# Patient Record
Sex: Male | Born: 1960 | Race: White | Hispanic: No | Smoking: Current every day smoker
Health system: Southern US, Community
[De-identification: ages and names within clinical notes are randomized; demographics above are authoritative.]

## PROBLEM LIST (undated history)

## (undated) ENCOUNTER — Encounter

## (undated) ENCOUNTER — Other Ambulatory Visit

## (undated) DIAGNOSIS — I10 Essential (primary) hypertension: Secondary | ICD-10-CM

---

## 2016-07-23 ENCOUNTER — Inpatient Hospital Stay: Admit: 2016-07-23 | Discharge: 2016-07-24 | Primary: Family Medicine

## 2016-07-23 ENCOUNTER — Ambulatory Visit: Attending: Family Medicine | Primary: Family Medicine

## 2016-07-23 DIAGNOSIS — E785 Hyperlipidemia, unspecified: Secondary | ICD-10-CM

## 2016-07-23 DIAGNOSIS — M79671 Pain in right foot: Secondary | ICD-10-CM

## 2016-07-23 DIAGNOSIS — M5416 Radiculopathy, lumbar region: Secondary | ICD-10-CM

## 2016-07-23 DIAGNOSIS — I10 Essential (primary) hypertension: Secondary | ICD-10-CM

## 2016-07-23 DIAGNOSIS — M150 Primary generalized (osteo)arthritis: Secondary | ICD-10-CM

## 2016-07-23 DIAGNOSIS — H4311 Vitreous hemorrhage, right eye: Secondary | ICD-10-CM

## 2016-07-23 DIAGNOSIS — G8929 Other chronic pain: Secondary | ICD-10-CM

## 2016-07-23 DIAGNOSIS — E7800 Pure hypercholesterolemia, unspecified: Secondary | ICD-10-CM

## 2016-07-23 DIAGNOSIS — I872 Venous insufficiency (chronic) (peripheral): Secondary | ICD-10-CM

## 2016-07-23 DIAGNOSIS — Z1159 Encounter for screening for other viral diseases: Secondary | ICD-10-CM

## 2016-07-23 DIAGNOSIS — M4726 Other spondylosis with radiculopathy, lumbar region: Secondary | ICD-10-CM

## 2016-07-23 DIAGNOSIS — E669 Obesity, unspecified: Secondary | ICD-10-CM

## 2016-07-23 DIAGNOSIS — Z1211 Encounter for screening for malignant neoplasm of colon: Principal | ICD-10-CM

## 2016-07-23 DIAGNOSIS — Z0000 Encounter for general adult medical examination without abnormal findings: Secondary | ICD-10-CM

## 2016-07-23 DIAGNOSIS — J209 Acute bronchitis, unspecified: Secondary | ICD-10-CM

## 2016-07-23 DIAGNOSIS — I2510 Atherosclerotic heart disease of native coronary artery without angina pectoris: Secondary | ICD-10-CM

## 2016-07-23 DIAGNOSIS — K219 Gastro-esophageal reflux disease without esophagitis: Secondary | ICD-10-CM

## 2016-07-23 MED ORDER — LEVOFLOXACIN 500 MG PO TABS
500 mg | Freq: Every day | ORAL | 0 refills | Status: CP
Start: 2016-07-23 — End: ?

## 2016-07-23 MED ORDER — ATORVASTATIN CALCIUM 10 MG PO TABS
40 mg | Freq: Every evening | ORAL
Start: 2016-07-23 — End: ?

## 2016-07-23 MED ORDER — BENZONATATE 100 MG PO CAPS
100 mg | Freq: Three times a day (TID) | ORAL | 0 refills | Status: CP | PRN
Start: 2016-07-23 — End: 2016-08-18

## 2016-07-24 ENCOUNTER — Encounter: Primary: Family Medicine

## 2016-07-24 NOTE — Progress Notes
-       IMMUNOCHEMICAL FECAL OCCULT BLOOD-DIAGNO; Future  -     IMMUNOCHEMICAL FECAL OCCULT BLOOD-DIAGNO    Need for hepatitis C screening test  -     HEPATITIS C ANTIBODY; Future  -     HEPATITIS C ANTIBODY    Essential hypertension  -     LIPID PANEL; Future  -     LIPID PANEL  -     Comprehensive Metabolic Panel; Future  -     CBC and Differential; Future  -     Comprehensive Metabolic Panel  -     CBC and Differential  -     PSA (SCREENING); Future    Hyperlipidemia, unspecified hyperlipidemia type  -     LIPID PANEL; Future  -     LIPID PANEL    Primary osteoarthritis involving multiple joints    Obesity (BMI 30-39.9)    Pure hypercholesterolemia    Gastroesophageal reflux disease without esophagitis    Coronary artery disease involving native coronary artery of native heart without angina pectoris    Chronic radicular pain of lower back  -     XR L Spine Min 4 Views; Future  -     Refer to Physical Therapy; Future    Vitreous hemorrhage of right eye  -     Refer to External Provider; Future    Venous insufficiency  -     Refer to Vascular Surgery; Future    Right foot pain  -     Refer to Podiatry; Future    Acute bronchitis, unspecified organism    Other orders  -     atorvastatin (LIPITOR) 10 MG PO Tablet; Take 40 mg by mouth nightly at bedtime.      Current Outpatient Prescriptions   Medication Sig Dispense Refill   ? aspirin 81 MG PO Tablet Delayed Release Take 81 mg by mouth daily.     ? atorvastatin (LIPITOR) 10 MG PO Tablet Take 40 mg by mouth nightly at bedtime.     ? Clopidogrel Bisulfate (PLAVIX PO) by mouth.     ? lisinopril (PRINIVIL,ZESTRIL) 40 MG PO Tablet Take 40 mg by mouth daily.       Orders Placed This Encounter   Procedures   ? IMMUNOCHEMICAL FECAL OCCULT BLOOD-DIAGNO     Standing Status:   Future     Number of Occurrences:   1     Standing Expiration Date:   01/22/2017   ? XR L Spine Min 4 Views     Standing Status:   Future     Standing Expiration Date:   07/23/2017

## 2016-07-24 NOTE — Progress Notes
-       CBC and Differential  -     PSA (SCREENING); Future    Hyperlipidemia, unspecified hyperlipidemia type  -     LIPID PANEL; Future  -     LIPID PANEL    Primary osteoarthritis involving multiple joints    Obesity (BMI 30-39.9)    Pure hypercholesterolemia    Gastroesophageal reflux disease without esophagitis    Coronary artery disease involving native coronary artery of native heart without angina pectoris    Chronic radicular pain of lower back  -     XR L Spine Min 4 Views; Future  -     Refer to Physical Therapy; Future    Vitreous hemorrhage of right eye  -     Refer to External Provider; Future    Venous insufficiency  -     Refer to Vascular Surgery; Future    Right foot pain  -     Refer to Podiatry; Future    Acute bronchitis, unspecified organism    Other orders  -     atorvastatin (LIPITOR) 10 MG PO Tablet; Take 40 mg by mouth nightly at bedtime.      Discussed plan of care with patient.      Patient agreed to using medication as directed, keeping follow up visits.      Risks verses benefits of medication and treatment were discussed.    Patient referred for LABS  PT advised to schedule and keep all appointments, if unable to make appointment advised to call office to cancel/reschedule.  patient advised to seek immediate medical care if they should experience any side effects from medications.     Patient voiced agreement and understanding.          Education:     Education provided about all diagnoses and printed materials provided to patient.

## 2016-07-24 NOTE — Progress Notes
He has No Known Allergies.  No results found for this or any previous visit.  Current Outpatient Prescriptions   Medication Sig Dispense Refill   ? aspirin 81 MG PO Tablet Delayed Release Take 81 mg by mouth daily.     ? atorvastatin (LIPITOR) 10 MG PO Tablet Take 40 mg by mouth nightly at bedtime.     ? Clopidogrel Bisulfate (PLAVIX PO) by mouth.     ? lisinopril (PRINIVIL,ZESTRIL) 40 MG PO Tablet Take 40 mg by mouth daily.       Objective:   PHYSICAL EXAM:  Constitutional:    Erik Phelps is alert and is in no apparent distress, vital signs are: BP (!) 130/96  - Pulse 91  - Resp 20  - Ht 1.854 m (6\' 1" )  - Wt 122 kg (269 lb)  - BMI 35.49 kg/m? Marland Kitchen.   HEENT:  Grossly normal. Well healed tracheostomy site  Neck:  Supple, no adenopathy.  No JVD, no bruits, no thyromegaly.  Chest:  Clear to auscultation bilaterally  Cardiac:  Regular rate and rhythm without new murmurs, rubs or gallops.  B well healed scar in chest wall  Abdomen:  Normoactive bowel sounds, soft, nontender, no hepatosplenomegaly.  Extremities:  2+ dorsal pedis pulses, no clubbing, cyanosis or edema. Varicose veins B legs  Lymph nodes:  No inguinal or axillary adenopathy.  Musculoskeletal:  Full range of motion without tenderness or swelling in all joints.    Assessment and Plan:     Encounter Diagnoses   Name Primary?   ? Screening for colon cancer    ? Need for hepatitis C screening test    ? Essential hypertension    ? Hyperlipidemia, unspecified hyperlipidemia type    ? Primary osteoarthritis involving multiple joints    ? Obesity (BMI 30-39.9)    ? Pure hypercholesterolemia    ? Gastroesophageal reflux disease without esophagitis    ? Coronary artery disease involving native coronary artery of native heart without angina pectoris    ? Chronic radicular pain of lower back    ? Vitreous hemorrhage of right eye    ? Venous insufficiency    ? Right foot pain    ? Acute bronchitis, unspecified organism      Screening for colon cancer

## 2016-07-24 NOTE — Progress Notes
?   HEPATITIS C ANTIBODY     Standing Status:   Future     Number of Occurrences:   1     Standing Expiration Date:   07/23/2017   ? LIPID PANEL     Standing Status:   Future     Number of Occurrences:   1     Standing Expiration Date:   07/23/2017   ? Comprehensive Metabolic Panel     Standing Status:   Future     Number of Occurrences:   1     Standing Expiration Date:   01/21/2017   ? PSA (SCREENING)     Standing Status:   Future     Standing Expiration Date:   07/23/2017   ? Refer to External Provider     Standing Status:   Future     Standing Expiration Date:   07/23/2017     Referral Type:   Evaluate and Treat     Referral Reason:   Service Not Available In-House     Requested Specialty:   Ophthalmology     Number of Visits Requested:   1   ? Refer to Vascular Surgery     Standing Status:   Future     Standing Expiration Date:   07/23/2017     Referral Priority:   Routine     Referral Type:   Evaluate and Treat     Requested Specialty:   Vascular Surgery     Number of Visits Requested:   1   ? Refer to Podiatry     Standing Status:   Future     Standing Expiration Date:   07/23/2017     Referral Priority:   Routine     Referral Type:   Evaluate and Treat     Requested Specialty:   Podiatry     Number of Visits Requested:   1   ? Refer to Physical Therapy     Standing Status:   Future     Standing Expiration Date:   07/23/2017     Referral Priority:   Routine     Referral Type:   Evaluate and Treat     Requested Specialty:   Physical Therapy     Number of Visits Requested:   1     Screening for colon cancer  -     IMMUNOCHEMICAL FECAL OCCULT BLOOD-DIAGNO; Future  -     IMMUNOCHEMICAL FECAL OCCULT BLOOD-DIAGNO    Need for hepatitis C screening test  -     HEPATITIS C ANTIBODY; Future  -     HEPATITIS C ANTIBODY    Essential hypertension  -     LIPID PANEL; Future  -     LIPID PANEL  -     Comprehensive Metabolic Panel; Future  -     CBC and Differential; Future  -     Comprehensive Metabolic Panel
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
## 2016-07-24 NOTE — Progress Notes
Please have pt followup for review of laboratory or imaging results.

## 2016-07-28 ENCOUNTER — Inpatient Hospital Stay: Admit: 2016-07-28 | Discharge: 2016-07-28 | Primary: Family Medicine

## 2016-07-28 NOTE — Initial Plan of Care
Date of Service:  07/27/2016    Name: Erik Phelps  /  DOB: Oct 28, 1960  /  MRN: [id number]    Visit Information:  ? Visit Number: 0  ? Left Without Being Seen Number: 0  ? Canceled Number: 1  ? No Show Number: 0  ? Comment: pt cx due to being sick    * Was an interpreter used: NA    ________________________  Dorathy KinsmanAmanda Rose Roura, PT

## 2016-07-28 NOTE — Initial Plan of Care
Date of Service:  07/27/2016    Name: Erik Phelps  /  DOB: [birth year]  /  MRN: [id number]    Visit Information:  ? Visit Number: 0  ? Left Without Being Seen Number: 0  ? Canceled Number: 0  ? No Show Number: 1  ? Comment: pt NS eval    * Was an interpreter used: NA    ________________________  Dorathy KinsmanAmanda Rose Roura, PT

## 2016-08-17 NOTE — Progress Notes
Appointment Date: Future Appointments  Date Time Provider Department Center   08/18/2016 10:30 AM Erik Phelps, Erik W, [facility]       Referring Physician: ***    PCP: Erik Phelps    Chief Complaint: venous insufficiency     Allergies: No Known Allergies    Labs: EPIC     Pathology: NONE     Radiology: NONE     Other Diagnostic Testing: NONE     Pharmacy:   [id number] [address] [place]- [place], [place] - [id number]  [id number]   [place] [id number]  Phone: [phone] Fax: [phone]      Checked By: Glenna FellowsFred L Smith, MA    Date: 08/17/2016

## 2016-08-18 ENCOUNTER — Ambulatory Visit: Attending: Surgery | Primary: Family Medicine

## 2016-08-18 DIAGNOSIS — I10 Essential (primary) hypertension: Principal | ICD-10-CM

## 2016-08-18 DIAGNOSIS — I872 Venous insufficiency (chronic) (peripheral): Principal | ICD-10-CM

## 2016-08-18 DIAGNOSIS — I219 Acute myocardial infarction, unspecified: Secondary | ICD-10-CM

## 2016-08-18 MED ORDER — ALBUTEROL SULFATE (2.5 MG/3ML) 0.083% IN NEBU: Start: 2016-08-18 — End: ?

## 2016-08-18 MED ORDER — METOPROLOL SUCCINATE ER 100 MG PO TB24: Start: 2016-08-18 — End: ?
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
## 2016-08-18 NOTE — Progress Notes
Teaching/Attestation Statement  I saw and evaluated the patient.  I reviewed the Resident note and agree with the assessment and plan. See Resident's note for details.
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
## 2016-08-18 NOTE — Progress Notes
?   aspirin 81 MG PO Tablet Delayed Release Take 81 mg by mouth daily.   ? atorvastatin (LIPITOR) 10 MG PO Tablet Take 40 mg by mouth nightly at bedtime.   ? Clopidogrel Bisulfate (PLAVIX PO) by mouth.   ? lisinopril (PRINIVIL,ZESTRIL) 40 MG PO Tablet Take 40 mg by mouth daily.   ? metoprolol succinate (TOPROL-XL) 100 MG PO Tablet Extended Release 24 Hour        No Known Allergies    ROS:    Review of Systems   Constitutional: Negative for chills, fever, malaise/fatigue and weight loss.   HENT: Negative.    Eyes: Negative.    Respiratory: Negative for cough, sputum production and shortness of breath.    Cardiovascular: Negative for chest pain, palpitations, orthopnea and leg swelling.   Gastrointestinal: Negative for abdominal pain, heartburn, nausea and vomiting.   Genitourinary: Negative.    Musculoskeletal:        BL lower leg pain worsened with standing   Skin: Negative.    Neurological: Negative.      Vitals:      Vitals:    08/18/16 1011   BP: 120/78   Pulse: 63   Resp: 16   Temp: 36.4 ?C (97.6 ?F)   Weight: 122.9 kg (271 lb)   Height: 1.854 m (6\' 1" )         Physical Exam:    Physical Exam   Constitutional: He is oriented to person, place, and time. He appears well-developed and well-nourished.   HENT:   Head: Normocephalic and atraumatic.   Eyes: Conjunctivae and EOM are normal.   Neck: Normal range of motion.   Well-healed trach scar   Cardiovascular: Normal rate, regular rhythm and normal heart sounds.    Pulmonary/Chest: Effort normal and breath sounds normal.   Abdominal: Soft. Bowel sounds are normal.   Musculoskeletal: Normal range of motion.        Right knee: Deformity: Surgical scar.   BL LE venous congestion with dilation of superficial veins. Minimal edema, not TTP. Intact distal pulses and good capillary refill.   Neurological: He is alert and oriented to person, place, and time.   Skin: Skin is warm and dry.   Psychiatric: He has a normal mood and affect. His behavior is normal.

## 2017-03-25 ENCOUNTER — Emergency Department (HOSPITAL_COMMUNITY)
Admission: EM | Admit: 2017-03-25 | Discharge: 2017-03-25 | Disposition: A | Discharge status: Home / Self Care | Payer: Worker's Compensation | Attending: Emergency Medicine | Admitting: Emergency Medicine

## 2017-03-25 ENCOUNTER — Emergency Department (HOSPITAL_COMMUNITY): Payer: Worker's Compensation

## 2017-03-25 ENCOUNTER — Other Ambulatory Visit: Payer: Self-pay

## 2017-03-25 ENCOUNTER — Encounter (HOSPITAL_COMMUNITY): Payer: Self-pay

## 2017-03-25 DIAGNOSIS — F17200 Nicotine dependence, unspecified, uncomplicated: Secondary | ICD-10-CM | POA: Diagnosis not present

## 2017-03-25 DIAGNOSIS — M25561 Pain in right knee: Secondary | ICD-10-CM | POA: Diagnosis not present

## 2017-03-25 DIAGNOSIS — S42402A Unspecified fracture of lower end of left humerus, initial encounter for closed fracture: Secondary | ICD-10-CM | POA: Diagnosis not present

## 2017-03-25 DIAGNOSIS — Y929 Unspecified place or not applicable: Secondary | ICD-10-CM | POA: Insufficient documentation

## 2017-03-25 DIAGNOSIS — I10 Essential (primary) hypertension: Secondary | ICD-10-CM | POA: Diagnosis not present

## 2017-03-25 DIAGNOSIS — Y9301 Activity, walking, marching and hiking: Secondary | ICD-10-CM | POA: Insufficient documentation

## 2017-03-25 DIAGNOSIS — Y998 Other external cause status: Secondary | ICD-10-CM | POA: Insufficient documentation

## 2017-03-25 DIAGNOSIS — W010XXA Fall on same level from slipping, tripping and stumbling without subsequent striking against object, initial encounter: Secondary | ICD-10-CM | POA: Diagnosis not present

## 2017-03-25 DIAGNOSIS — S59902A Unspecified injury of left elbow, initial encounter: Secondary | ICD-10-CM | POA: Diagnosis present

## 2017-03-25 DIAGNOSIS — Z79899 Other long term (current) drug therapy: Secondary | ICD-10-CM | POA: Diagnosis not present

## 2017-03-25 HISTORY — DX: Essential (primary) hypertension: I10

## 2017-03-25 MED ORDER — MORPHINE SULFATE (PF) 4 MG/ML IV SOLN
4.0000 mg | Freq: Once | INTRAVENOUS | Status: AC
  Administered 2017-03-25: 4 mg via INTRAVENOUS
  Filled 2017-03-25: qty 1

## 2017-03-25 MED ORDER — LISINOPRIL 20 MG PO TABS
40.0000 mg | ORAL_TABLET | Freq: Once | ORAL | Status: AC
Start: 2017-03-25 — End: 2017-03-25
  Administered 2017-03-25: 40 mg via ORAL
  Filled 2017-03-25: qty 2

## 2017-03-25 MED ORDER — OXYCODONE-ACETAMINOPHEN 5-325 MG PO TABS: 1.0000 | ORAL_TABLET | ORAL | 0 refills | Status: AC | PRN

## 2017-03-25 MED ORDER — LABETALOL HCL 5 MG/ML IV SOLN: 10.0000 mg | Freq: Once | INTRAVENOUS | Status: DC

## 2017-03-25 MED ORDER — HYDROMORPHONE HCL 1 MG/ML IJ SOLN
1.0000 mg | Freq: Once | INTRAMUSCULAR | Status: AC
  Administered 2017-03-25: 1 mg via INTRAVENOUS
  Filled 2017-03-25: qty 1

## 2017-03-25 MED ORDER — HYDROMORPHONE HCL 1 MG/ML IJ SOLN
1.0000 mg | Freq: Once | INTRAMUSCULAR | Status: AC
Start: 2017-03-25 — End: 2017-03-25
  Administered 2017-03-25: 1 mg via INTRAVENOUS
  Filled 2017-03-25: qty 1

## 2017-03-25 MED ORDER — METOPROLOL SUCCINATE ER 100 MG PO TB24
100.0000 mg | ORAL_TABLET | Freq: Every day | ORAL | Status: DC
  Administered 2017-03-25: 100 mg via ORAL
  Filled 2017-03-25: qty 1

## 2017-03-25 MED ORDER — HYDROMORPHONE HCL 1 MG/ML IJ SOLN: 1.0000 mg | Freq: Once | INTRAMUSCULAR | Status: DC

## 2017-03-25 MED FILL — OXYCOD/ACETAMINOPHEN 5-325M: 5-325 | 3 days supply | Qty: 20 | Fill #0

## 2017-03-25 NOTE — ED Notes (Signed)
No respiratory or acute distress noted alert and oriented x 3 call light in reach no reaction to medication noted. 
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
## 2017-03-25 NOTE — ED Notes (Signed)
Patient transported to CT 

## 2017-03-25 NOTE — ED Triage Notes (Signed)
Fell no LOC and now left elbow pain and right knee pain swelling noted in left hand good circulation noted able to bear weight on right knee.

## 2017-03-25 NOTE — ED Notes (Signed)
ED Provider at bedside. PFIEFFER AT BEDSIDE

## 2017-03-25 NOTE — Discharge Instructions (Signed)
Your workup today showed evidence of elbow fracture.  Please keep the splint dry and follow-up with Dr. Eulah PontMurphy with orthopedics tomorrow morning.  Please use the pain medicine to help with your symptoms.  Please keep your elbow elevated.  If any symptoms change or worsen, please return to the nearest ED.

## 2017-03-25 NOTE — ED Notes (Signed)
ORTHO JON
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
## 2017-03-25 NOTE — ED Notes (Signed)
ED Provider at bedside. 

## 2017-03-25 NOTE — ED Provider Notes (Signed)
[facility]-EMERGENCY DEPT Provider Note   CSN: [id number] Arrival date & time: 03/25/17  0253     History   Chief Complaint Chief Complaint  Patient presents with  . Fall  . right knee pain  . left arm pain    HPI Joshua Dennis is a [age] y.o. male.  The history is provided by the patient. No language interpreter was used.  Arm Injury   This is a new problem. The current episode started less than 1 hour ago. The problem occurs constantly. The problem has not changed since onset.The pain is present in the left elbow. The quality of the pain is described as constant and sharp. The pain is at a severity of 10/10. The pain is severe. Associated symptoms include limited range of motion. Pertinent negatives include no numbness, no stiffness, no tingling and no itching. He has tried nothing for the symptoms. The treatment provided no relief. There has been a history of trauma. Family history is significant for no rheumatoid arthritis and no gout.    Past Medical History:  Diagnosis Date  . Hypertension     There are no active problems to display for this patient.   History reviewed. No pertinent surgical history.     Home Medications    Prior to Admission medications   Medication Sig Start Date End Date Taking? Authorizing Provider  lisinopril (PRINIVIL,ZESTRIL) 40 MG tablet Take 40 mg by mouth daily.   Yes [provider]  metoprolol succinate (TOPROL-XL) 100 MG 24 hr tablet Take 100 mg by mouth daily. 07/28/16  Yes [provider]    Family History History reviewed. No pertinent family history.  Social History Social History   Tobacco Use  . Smoking status: Current Every Day Smoker  . Smokeless tobacco: Never Used  Substance Use Topics  . Alcohol use: Yes    Frequency: Never  . Drug use: No     Allergies   Patient has no known allergies.   Review of Systems Review of Systems  Constitutional: Negative for chills,  diaphoresis, fatigue and fever.  HENT: Negative for congestion.   Eyes: Negative for visual disturbance.  Respiratory: Negative for cough, chest tightness, shortness of breath, wheezing and stridor.   Cardiovascular: Negative for chest pain and palpitations.  Gastrointestinal: Negative for abdominal pain, constipation, diarrhea, nausea and vomiting.  Genitourinary: Negative for dysuria.  Musculoskeletal: Negative for back pain, neck pain, neck stiffness and stiffness.  Skin: Positive for wound. Negative for itching and rash.  Neurological: Negative for tingling, light-headedness, numbness and headaches.  Psychiatric/Behavioral: Negative for behavioral problems.  All other systems reviewed and are negative.    Physical Exam Updated Vital Signs BP (!) 177/117 (BP Location: Right Arm)   Pulse 85   Resp 20   Ht (!) 6' (1.829 m)   Wt (!) 102.1 kg (225 lb)   SpO2 97%   BMI 30.52 kg/m   Physical Exam  Constitutional: He is oriented to person, place, and time. He appears well-developed and well-nourished. No distress.  HENT:  Head: Normocephalic and atraumatic.  Mouth/Throat: Oropharynx is clear and moist. No oropharyngeal exudate.  Eyes: Conjunctivae are normal. Pupils are equal, round, and reactive to light.  Neck: Neck supple.  Cardiovascular: Normal rate and regular rhythm.  No murmur heard. Pulmonary/Chest: Effort normal and breath sounds normal. No respiratory distress. He has no wheezes. He exhibits no tenderness.  Abdominal: Soft. There is no tenderness.  Musculoskeletal: He exhibits tenderness. He exhibits no  edema or deformity.       Left elbow: He exhibits swelling. He exhibits no deformity and no laceration. Tenderness found.       Right knee: He exhibits normal range of motion and no laceration. Tenderness found.  Neurological: He is alert and oriented to person, place, and time. No sensory deficit. He exhibits normal muscle tone.  Skin: Skin is warm. Capillary refill  takes less than 2 seconds. He is not diaphoretic. No erythema. No pallor.  Psychiatric: He has a normal mood and affect.  Nursing note and vitals reviewed.    ED Treatments / Results  Labs (all labs ordered are listed, but only abnormal results are displayed) Labs Reviewed - No data to display  EKG  EKG Interpretation None       Radiology Dg Elbow Complete Left  Result Date: 03/25/2017 CLINICAL DATA:  Trip and fall in parking lot this morning, left elbow and forearm pain. Fractures on forearm radiograph. EXAM: LEFT ELBOW - COMPLETE 3+ VIEW COMPARISON:  Left forearm radiograph earlier this day. FINDINGS: Technically limited exam due to difficulty with patient positioning secondary to pain. Mildly depressed radial head/neck fracture with intra-articular extension. Radiocapitellar alignment appears normal on lateral view. Curvilinear 16 mm fracture fragment about the lateral elbow, possibly from the lateral epicondyle and rotated, with mild cortical irregularity about the lateral condyle. There is a joint effusion. IMPRESSION: 1. Mildly depressed radial head/neck fracture with intra-articular extension. 2. Fracture fragment about the lateral elbow, donor site likely from the lateral humeral epicondyle, with adjacent cortical irregularity. 3. Joint effusion. Electronically Signed   By: Rubye Oaks M.D.   On: 03/25/2017 05:21   Dg Forearm Left  Result Date: 03/25/2017 CLINICAL DATA:  Trip and fall in parking lot, arm injury. EXAM: LEFT FOREARM - 2 VIEW COMPARISON:  None. FINDINGS: Acute appearing radial head and olecranon fractures suspected. Mild dorsal distal forearm soft tissue swelling without subcutaneous gas IMPRESSION: Acute appearing LEFT elbow fractures, recommend dedicated radiograph. Electronically Signed   By: Awilda Metro M.D.   On: 03/25/2017 04:10   Dg Knee Complete 4 Views Right  Result Date: 03/25/2017 CLINICAL DATA:  Tripped and fell in parking lot. EXAM: RIGHT  KNEE - COMPLETE 4+ VIEW COMPARISON:  None. FINDINGS: No acute fracture deformity or dislocation. Severe lateral compartment narrowing with periarticular sclerosis and marginal spurring, moderate within patella femoral and medial compartments. Faint periarticular calcifications suggesting CPPD. No destructive bony lesions. Moderate suprapatellar joint effusion. IMPRESSION: No acute fracture deformity or dislocation. Moderate suprapatellar joint effusion. Tricompartmental osteoarthrosis, severe within lateral compartment. Electronically Signed   By: Awilda Metro M.D.   On: 03/25/2017 04:10    Procedures Procedures (including critical care time)  Medications Ordered in ED Medications  morphine 4 MG/ML injection 4 mg (4 mg Intravenous Given 03/25/17 0328)  HYDROmorphone (DILAUDID) injection 1 mg (1 mg Intravenous Given 03/25/17 0533)     Initial Impression / Assessment and Plan / ED Course  I have reviewed the triage vital signs and the nursing notes.  Pertinent labs & imaging results that were available during my care of the patient were reviewed by me and considered in my medical decision making (see chart for details).     Joshua Dennis is a 57 y.o. male with a past medical history significant for hypertension who presents with fall.  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  He reports that while walking outside, he tripped  and landed on his left elbow and right knee.  He reports immediate onset of severe pain.  On arrival, patient has 10 out of 10 pain in his left elbow and minimal to moderate pain in his right knee.  There is no evidence of open fracture present.  Patient denies hitting his head.  He denies headache, neck pain, chest pain abdominal pain or other symptoms.  He had not taken medicine at home before calling for assistance.  Patient was given fentanyl in route with continued pain.  On exam, patient has palpable  pulses in radial and ulnar arteries of the left arm.  Patient also has normal sensation throughout the hand.  Normal capillary refill.  Patient did have reported weakness with grip strength however this is felt to be secondary due to pain.  Patient had tenderness in the left elbow but his compartments were soft.  Patient had no other tenderness on his torso.  Patient's right knee was tender with some abrasions.  Patient was able to move his right knee and was able to ambulate.  Patient had sensation and strength in his legs.  X-rays show evidence of elbow fracture.  Dr. Eulah Pont with orthopedics was called who recommended splinting, CT scan, and he will see the patient in clinic tomorrow morning for further management.  Patient agreed with plan and was given prescription for pain medication.  Patient will follow-up and understood return precautions and cast management.  Patient will be discharged after CT scan for further management with orthopedics tomorrow.    Final Clinical Impressions(s) / ED Diagnoses   Final diagnoses:  Left elbow fracture, closed, initial encounter    ED Discharge Orders        Ordered    oxyCODONE-acetaminophen (PERCOCET/ROXICET) 5-325 MG tablet  Every 4 hours PRN     03/25/17 0818      Clinical Impression: 1. Left elbow fracture, closed, initial encounter     Disposition: Discharge  Condition: Good  I have discussed the results, Dx and Tx plan with the pt(& family if present). He/she/they expressed understanding and agree(s) with the plan. Discharge instructions discussed at great length. Strict return precautions discussed and pt &/or family have verbalized understanding of the instructions. No further questions at time of discharge.    New Prescriptions   OXYCODONE-ACETAMINOPHEN (PERCOCET/ROXICET) 5-325 MG TABLET    Take 1 tablet by mouth every 4 (four) hours as needed for severe pain.    Follow Up: Sheral Apley, MD [address] [place] [address] [phone]  In 1 day   [facility] [address] [id number] [address] [place] [phone]  If symptoms worsen     Tegeler, Canary Brim, MD 03/25/17 [phone]

## 2017-03-25 NOTE — ED Notes (Signed)
EDP PFEIFER MADE AWARE OF PT CURRENT STATUS. ELEVATED PT AND PAIN LEVEL. EDP PLACED ORDERS FOR BP AND PAIN MEDICATIONS TO BE GIVEN. EDP ALSO MADE MENTION FOR PT TO BE RE EVALUATED BY ORTH MD. THIS INFORMATION WAS GIVEN TO PT. PT WAS GIVEN THE RIGHT TO SPEAK WITH OUR CHARGE AND DIRECTOR AND DECLINED. PT STATES HE DECLINES ANY ADDITIONAL TREATMENT AT THIS TIME AND WOULD LIKE TO BE DISCHARGE.

## 2017-03-25 NOTE — ED Notes (Signed)
PT AGREED TO ICE PACKS, AND I GAVE DIRECTIONS TO [facility] TO FILL RX.PT ALLOWED THIS WRITER TO EDUCATION HIM ON PROPER APPLICATION AND WEARING OF SLING. PT LEFT WITHOUT COMPLAINT.

## 2017-03-25 NOTE — ED Notes (Signed)
ED Provider at bedside. EDP PFEIFER PRESENT TO WITNESS PT DECLINE ADDITIONAL TREATMENT

## 2017-03-25 NOTE — ED Triage Notes (Signed)
[facility] complaints of tripping over tree branches and patient states he fell on his left arm. EMS given Fentanyl 200 mcg IV with decrease in pain to 8/10. Patient also complaining of right knee pain.

## 2017-03-25 NOTE — ED Notes (Signed)
DECLINES ICE PACKS AT DISCHARGE

## 2019-05-02 IMAGING — CR DG ELBOW COMPLETE 3+V*L*
5 series · 5 of 5 positions shown · non-contrast
Comparison: Left forearm radiograph earlier this day.

CLINICAL DATA: Trip and fall in parking lot this morning, left
elbow and forearm pain. Fractures on forearm radiograph.

EXAM:
LEFT ELBOW - COMPLETE 3+ VIEW

[x elbow obl left (1 of 2)]
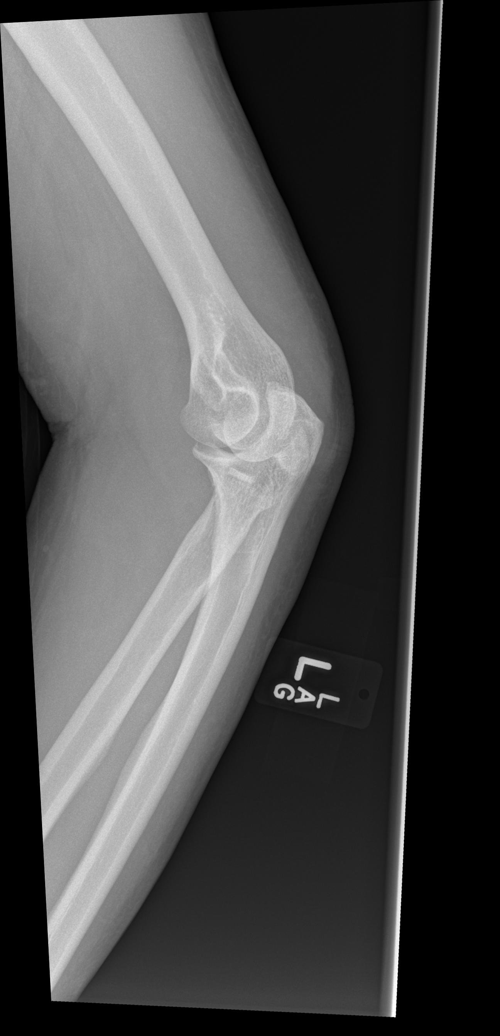

[x elbow obl left (2 of 2)]
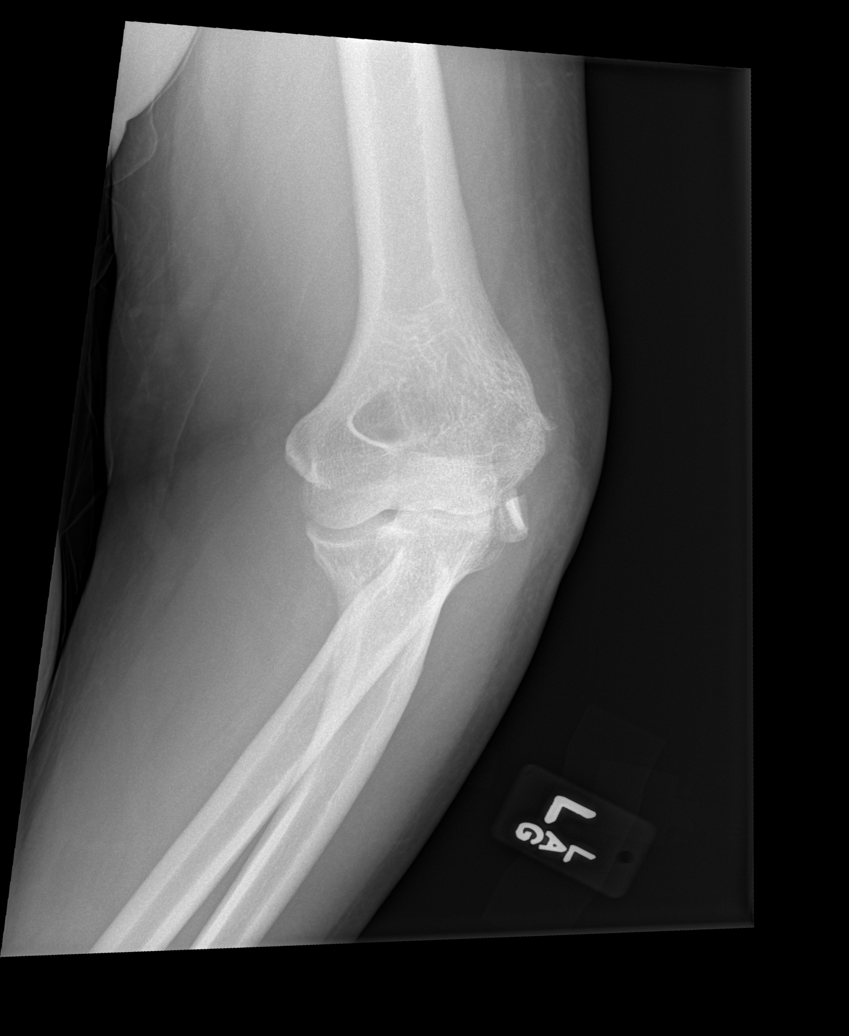

[x elbow lat left (1 of 3)]
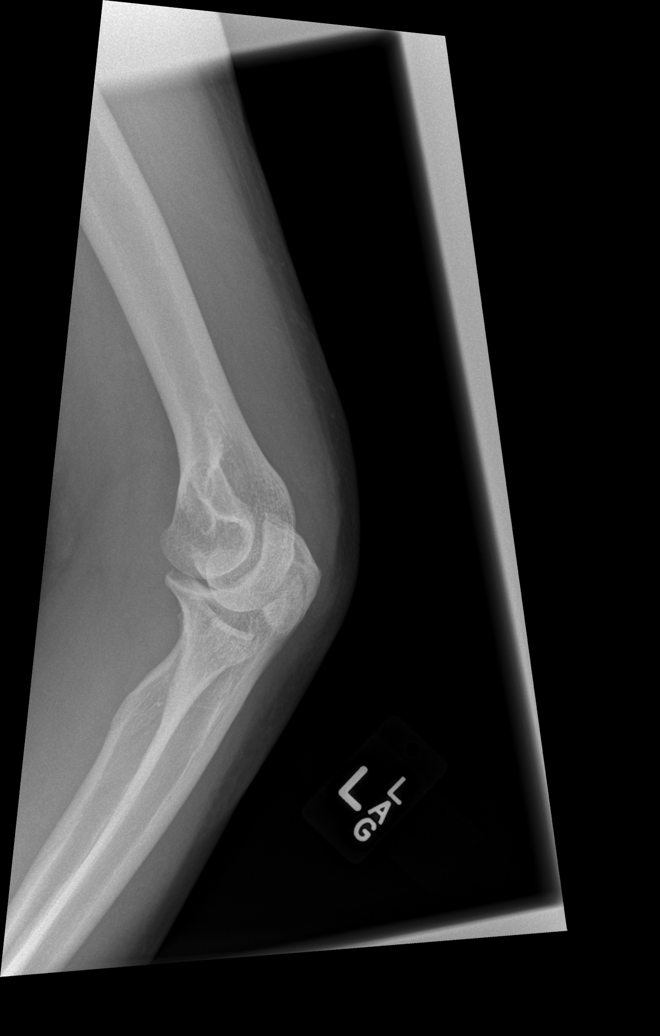

[x elbow lat left (2 of 3)]
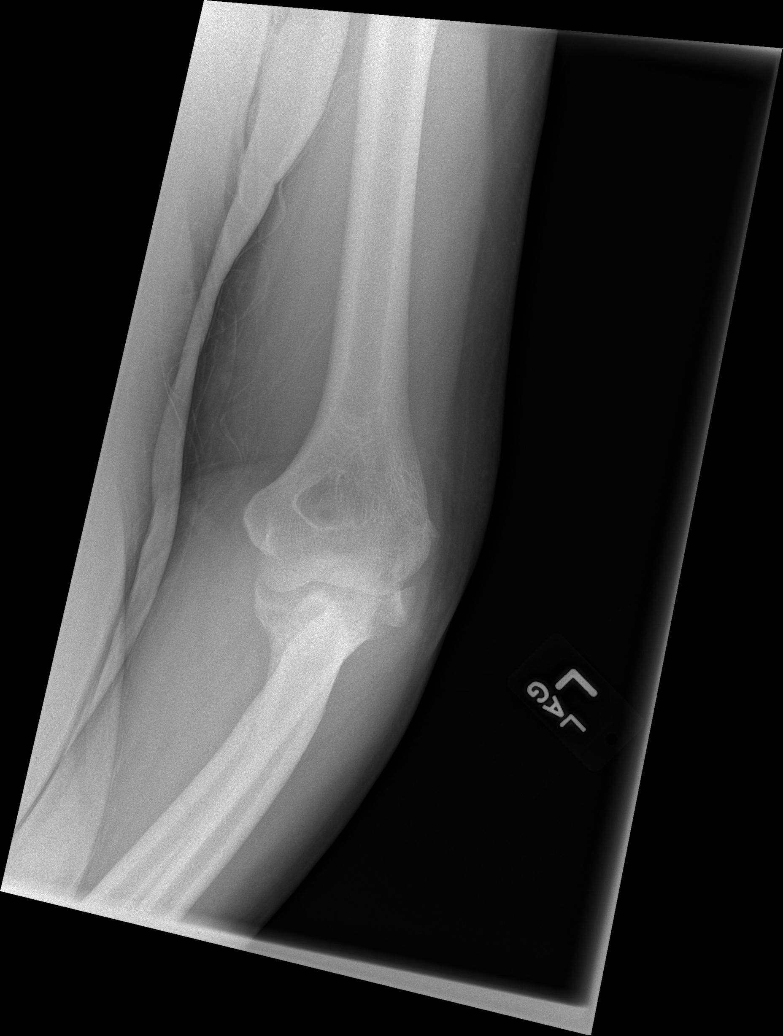

[x elbow lat left (3 of 3)]
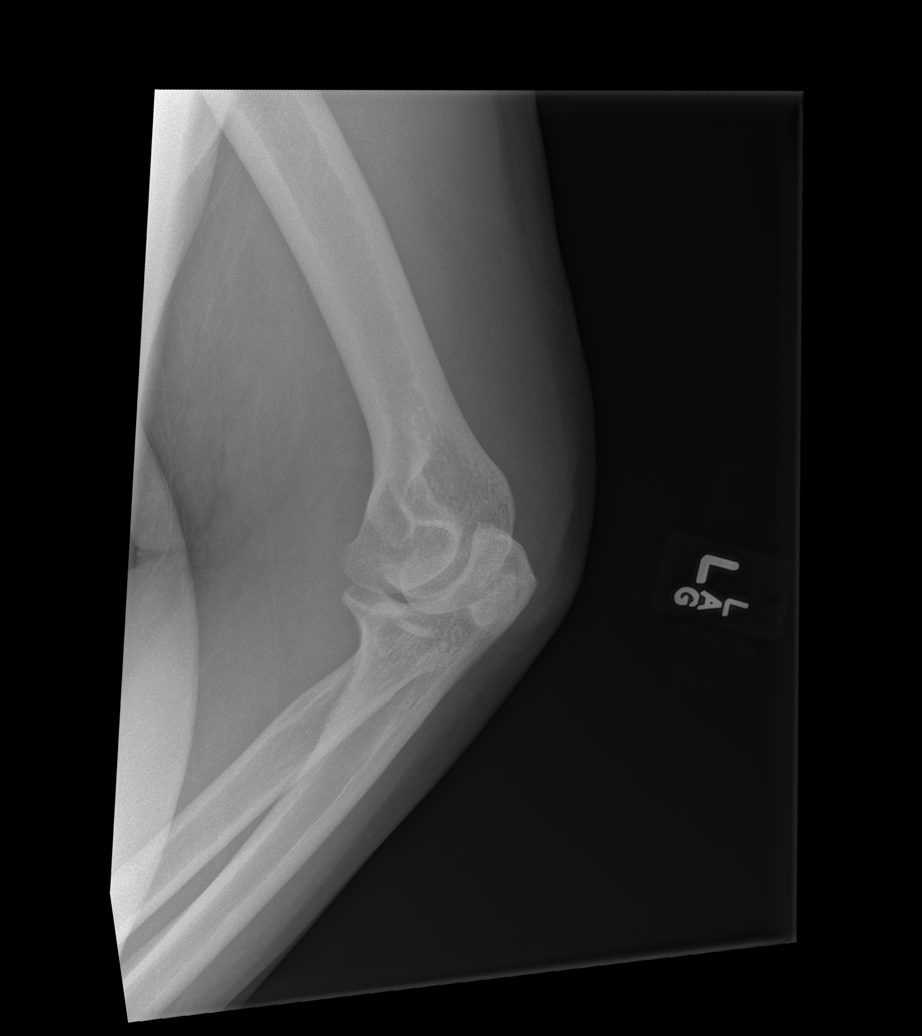

[5 of 5 positions shown; findings below may reference images not displayed]

FINDINGS: Technically limited exam due to difficulty with patient positioning
secondary to pain. Mildly depressed radial head/neck fracture with
intra-articular extension. Radiocapitellar alignment appears normal
on lateral view. Curvilinear 16 mm fracture fragment about the
lateral elbow, possibly from the lateral epicondyle and rotated,
with mild cortical irregularity about the lateral condyle. There is
a joint effusion.
IMPRESSION: 1. Mildly depressed radial head/neck fracture with intra-articular
extension.
2. Fracture fragment about the lateral elbow, donor site likely from
the lateral humeral epicondyle, with adjacent cortical irregularity.
3. Joint effusion.
